# Patient Record
Sex: Female | Born: 1940 | Race: White | Hispanic: No | Marital: Married | Smoking: Never smoker
Health system: Southern US, Community
[De-identification: ages and names within clinical notes are randomized; demographics above are authoritative.]

## PROBLEM LIST (undated history)

## (undated) DIAGNOSIS — I4891 Unspecified atrial fibrillation: Secondary | ICD-10-CM

## (undated) DIAGNOSIS — I509 Heart failure, unspecified: Secondary | ICD-10-CM

## (undated) DIAGNOSIS — E7800 Pure hypercholesterolemia, unspecified: Secondary | ICD-10-CM

## (undated) DIAGNOSIS — E079 Disorder of thyroid, unspecified: Secondary | ICD-10-CM

## (undated) HISTORY — PX: MEDIAL PARTIAL KNEE REPLACEMENT: SHX5965

## (undated) HISTORY — PX: CHOLECYSTECTOMY: SHX55

## (undated) HISTORY — PX: ABDOMINAL HYSTERECTOMY: SHX81

## (undated) HISTORY — PX: CARDIAC ELECTROPHYSIOLOGY STUDY AND ABLATION: SHX1294

---

## 2013-10-12 ENCOUNTER — Emergency Department (HOSPITAL_COMMUNITY): Payer: Medicare Other

## 2013-10-12 ENCOUNTER — Emergency Department (HOSPITAL_COMMUNITY)
Admission: EM | Admit: 2013-10-12 | Discharge: 2013-10-12 | Disposition: A | Discharge status: Home / Self Care | Payer: Medicare Other | Attending: Emergency Medicine | Admitting: Emergency Medicine

## 2013-10-12 ENCOUNTER — Encounter (HOSPITAL_COMMUNITY): Payer: Self-pay | Admitting: Emergency Medicine

## 2013-10-12 DIAGNOSIS — S99929A Unspecified injury of unspecified foot, initial encounter: Secondary | ICD-10-CM

## 2013-10-12 DIAGNOSIS — E079 Disorder of thyroid, unspecified: Secondary | ICD-10-CM | POA: Insufficient documentation

## 2013-10-12 DIAGNOSIS — S99919A Unspecified injury of unspecified ankle, initial encounter: Secondary | ICD-10-CM

## 2013-10-12 DIAGNOSIS — S0990XA Unspecified injury of head, initial encounter: Secondary | ICD-10-CM | POA: Insufficient documentation

## 2013-10-12 DIAGNOSIS — R51 Headache: Secondary | ICD-10-CM

## 2013-10-12 DIAGNOSIS — S8990XA Unspecified injury of unspecified lower leg, initial encounter: Secondary | ICD-10-CM | POA: Insufficient documentation

## 2013-10-12 DIAGNOSIS — E7800 Pure hypercholesterolemia, unspecified: Secondary | ICD-10-CM | POA: Insufficient documentation

## 2013-10-12 DIAGNOSIS — W010XXA Fall on same level from slipping, tripping and stumbling without subsequent striking against object, initial encounter: Secondary | ICD-10-CM | POA: Insufficient documentation

## 2013-10-12 DIAGNOSIS — Y9229 Other specified public building as the place of occurrence of the external cause: Secondary | ICD-10-CM | POA: Insufficient documentation

## 2013-10-12 DIAGNOSIS — M25512 Pain in left shoulder: Secondary | ICD-10-CM

## 2013-10-12 DIAGNOSIS — I509 Heart failure, unspecified: Secondary | ICD-10-CM | POA: Insufficient documentation

## 2013-10-12 DIAGNOSIS — R519 Headache, unspecified: Secondary | ICD-10-CM

## 2013-10-12 DIAGNOSIS — S46909A Unspecified injury of unspecified muscle, fascia and tendon at shoulder and upper arm level, unspecified arm, initial encounter: Secondary | ICD-10-CM | POA: Insufficient documentation

## 2013-10-12 DIAGNOSIS — W19XXXA Unspecified fall, initial encounter: Secondary | ICD-10-CM

## 2013-10-12 DIAGNOSIS — S4980XA Other specified injuries of shoulder and upper arm, unspecified arm, initial encounter: Secondary | ICD-10-CM | POA: Insufficient documentation

## 2013-10-12 DIAGNOSIS — Y939 Activity, unspecified: Secondary | ICD-10-CM | POA: Insufficient documentation

## 2013-10-12 DIAGNOSIS — I4891 Unspecified atrial fibrillation: Secondary | ICD-10-CM | POA: Insufficient documentation

## 2013-10-12 DIAGNOSIS — Z96659 Presence of unspecified artificial knee joint: Secondary | ICD-10-CM | POA: Insufficient documentation

## 2013-10-12 HISTORY — DX: Heart failure, unspecified: I50.9

## 2013-10-12 HISTORY — DX: Pure hypercholesterolemia, unspecified: E78.00

## 2013-10-12 HISTORY — DX: Disorder of thyroid, unspecified: E07.9

## 2013-10-12 HISTORY — DX: Unspecified atrial fibrillation: I48.91

## 2013-10-12 NOTE — ED Provider Notes (Signed)
CSN: [id number]     Arrival date & time 10/12/13  1312 History   First MD Initiated Contact with Patient 10/12/13 1330     Chief Complaint  Patient presents with  . Fall     (Consider location/radiation/quality/duration/timing/severity/associated sxs/prior Treatment) HPI 73 yo female presents [place] at airport today around 11:30 today. Patient went down face first. Denies LOC but uncertain if she hit her head or not. Paitient does admit to HA and is concerned because she is on Eloquis for her A. Fib. Patient also states that her left shoulder and left knee also hurt. Patient states she hit her knee and fell on her left shoulder with arm adducted to left side. Denies dizziness, CP, or SOB. Patient admits left sided HA rated at 4/10 currently. Pain is constant and described as pressure pain.   Past Medical History  Diagnosis Date  . Atrial fibrillation   . High cholesterol   . Thyroid disease   . CHF (congestive heart failure)    Past Surgical History  Procedure Laterality Date  . Medial partial knee replacement    . Abdominal hysterectomy    . Cardiac electrophysiology study and ablation    . Cholecystectomy     History reviewed. No pertinent family history. History  Substance Use Topics  . Smoking status: Never Smoker   . Smokeless tobacco: Never Used  . Alcohol Use: No   OB History   Grav Para Term Preterm Abortions TAB SAB Ect Mult Living                 Review of Systems  All other systems reviewed and are negative.     Allergies  Prednisone and Tdap  Home Medications   Prior to Admission medications   Not on File   BP 142/63  Pulse 68  Temp(Src) 97.9 F (36.6 C) (Oral)  Resp 18  Ht 5\' 4"  (1.626 m)  Wt 185 lb (83.915 kg)  BMI 31.74 kg/m2  SpO2 99% Physical Exam  Nursing note and vitals reviewed. Constitutional: She is oriented to person, place, and time. She appears well-developed and well-nourished. No distress.  HENT:  Head:  Normocephalic and atraumatic. Head is without raccoon's eyes, without Battle's sign, without abrasion, without contusion, without laceration, without right periorbital erythema and without left periorbital erythema.  Right Ear: Tympanic membrane and ear canal normal. Tympanic membrane is not perforated. No hemotympanum.  Left Ear: Tympanic membrane and ear canal normal. Tympanic membrane is not perforated. No hemotympanum.  Nose: Nose normal.  Mouth/Throat: Uvula is midline, oropharynx is clear and moist and mucous membranes are normal.  Eyes: Conjunctivae and EOM are normal. Pupils are equal, round, and reactive to light.  Neck: Trachea normal, normal range of motion and phonation normal. Neck supple. No JVD present. Carotid bruit is not present. No tracheal deviation present.  Cardiovascular: Normal rate and regular rhythm.  Exam reveals no gallop and no friction rub.   No murmur heard. Pulmonary/Chest: Effort normal and breath sounds normal. No respiratory distress. She has no wheezes. She has no rhonchi. She has no rales.  Musculoskeletal: Normal range of motion. She exhibits no edema.  Neurological: She is alert and oriented to person, place, and time. She has normal strength. No cranial nerve deficit or sensory deficit. GCS eye subscore is 4. GCS verbal subscore is 5. GCS motor subscore is 6.  Reflex Scores:      Bicep reflexes are 2+ on the right side and 2+ on  the left side.      Patellar reflexes are 2+ on the right side and 2+ on the left side. CN II-XII grossly intact. Cerebellar function appears intact with finger to nose exam. Patient ambulates in room without assistance.    Skin: Skin is warm and dry. She is not diaphoretic.  Psychiatric: She has a normal mood and affect. Her behavior is normal.    ED Course  Procedures (including critical care time) Labs Review Labs Reviewed - No data to display  Imaging Review Ct Head Wo Contrast  10/12/2013   CLINICAL DATA:  Head trauma.  Fall with head trauma. Patient on blood thinners.  EXAM: CT HEAD WITHOUT CONTRAST  TECHNIQUE: Contiguous axial images were obtained from the base of the skull through the vertex without intravenous contrast.  COMPARISON:  None.  FINDINGS: Intracranial atherosclerosis is present. Mastoid air cells and paranasal sinuses clear. Left-sided nasal septal spur. No mass lesion, mass effect, midline shift, hydrocephalus, hemorrhage. No territorial ischemia or acute infarction. Scout images appear within normal limits.  IMPRESSION: Negative CT head.   Electronically Signed   By: Andreas NewportGeoffrey  Lamke M.D.   On: 10/12/2013 14:59   Dg Shoulder Left  10/12/2013   CLINICAL DATA:  Pain post fall  EXAM: LEFT SHOULDER - 2+ VIEW  COMPARISON:  None.  FINDINGS: Three views of the left shoulder submitted. No acute fracture or subluxation. Degenerative changes are noted left AC joint. Mild inferior spurring of acromion. Glenohumeral joint is preserved.  IMPRESSION: No acute fracture or subluxation. Degenerative changes as described above.   Electronically Signed   By: Natasha MeadLiviu  Pop M.D.   On: 10/12/2013 15:16     EKG Interpretation   Date/Time:  Monday October 12 2013 13:13:30 EDT Ventricular Rate:  67 PR Interval:  146 QRS Duration: 77 QT Interval:  439 QTC Calculation: 463 R Axis:   7 Text Interpretation:  Sinus rhythm Low voltage, precordial leads  Borderline T abnormalities, anterior leads Confirmed by Freida BusmanALLEN  MD, ANTHONY  ([id number]) on 10/12/2013 2:44:26 PM Also confirmed by Freida BusmanALLEN  MD, ANTHONY  ([id number])  on 10/12/2013 3:23:37 PM      MDM   Final diagnoses:  Fall  HA (headache)  Shoulder pain, left   Patient with hx of blood thinner use presents after fall with HA with concern for bleed.   Filed Vitals:   10/12/13 1315 10/12/13 1525  BP: 125/62 142/63  Pulse: 68   Temp: 97.9 F (36.6 C)   TempSrc: Oral   Resp: 18   Height: 5\' 4"  (1.626 m)   Weight: 185 lb (83.915 kg)   SpO2: 99%     Patient afebrile with  normal VS.  Patient has normal neuro exam with no focal deficits.  CT head is negative. No evidence of bleed.  Left shoulder plain films show degenerative changes without evidence of acute fracture or dislocation.   Discussed results with patient. Plan to have patient follow up with her PCP in 2 days for reevaluation. Recommend tylenol for pain as needed. Return precautions for worsening HA, dizziness, or vomiting given.  Patient agrees with plan. Discharged in good condition.     Rudene AndaJacob Gray Rylan Kaufmann, PA-C 10/13/13 [phone]

## 2013-10-12 NOTE — Discharge Instructions (Signed)
Take tylenol as needed for pain. Follow up with your doctor in 2 days. Return to Emergency department if you develop any worsening symptoms of headache, dizziness, or vomiting.    Headaches, Frequently Asked Questions MIGRAINE HEADACHES Q: What is migraine? What causes it? How can I treat it? A: Generally, migraine headaches begin as a dull ache. Then they develop into a constant, throbbing, and pulsating pain. You may experience pain at the temples. You may experience pain at the front or back of one or both sides of the head. The pain is usually accompanied by a combination of:  Nausea.  Vomiting.  Sensitivity to light and noise. Some people (about 15%) experience an aura (see below) before an attack. The cause of migraine is believed to be chemical reactions in the brain. Treatment for migraine may include over-the-counter or prescription medications. It may also include self-help techniques. These include relaxation training and biofeedback.  Q: What is an aura? A: About 15% of people with migraine get an "aura". This is a sign of neurological symptoms that occur before a migraine headache. You may see wavy or jagged lines, dots, or flashing lights. You might experience tunnel vision or blind spots in one or both eyes. The aura can include visual or auditory hallucinations (something imagined). It may include disruptions in smell (such as strange odors), taste or touch. Other symptoms include:  Numbness.  A "pins and needles" sensation.  Difficulty in recalling or speaking the correct word. These neurological events may last as long as 60 minutes. These symptoms will fade as the headache begins. Q: What is a trigger? A: Certain physical or environmental factors can lead to or "trigger" a migraine. These include:  Foods.  Hormonal changes.  Weather.  Stress. It is important to remember that triggers are different for everyone. To help prevent migraine attacks, you need to figure  out which triggers affect you. Keep a headache diary. This is a good way to track triggers. The diary will help you talk to your healthcare professional about your condition. Q: Does weather affect migraines? A: Bright sunshine, hot, humid conditions, and drastic changes in barometric pressure may lead to, or "trigger," a migraine attack in some people. But studies have shown that weather does not act as a trigger for everyone with migraines. Q: What is the link between migraine and hormones? A: Hormones start and regulate many of your body's functions. Hormones keep your body in balance within a constantly changing environment. The levels of hormones in your body are unbalanced at times. Examples are during menstruation, pregnancy, or menopause. That can lead to a migraine attack. In fact, about three quarters of all women with migraine report that their attacks are related to the menstrual cycle.  Q: Is there an increased risk of stroke for migraine sufferers? A: The likelihood of a migraine attack causing a stroke is very remote. That is not to say that migraine sufferers cannot have a stroke associated with their migraines. In persons under age 73, the most common associated factor for stroke is migraine headache. But over the course of a person's normal life span, the occurrence of migraine headache may actually be associated with a reduced risk of dying from cerebrovascular disease due to stroke.  Q: What are acute medications for migraine? A: Acute medications are used to treat the pain of the headache after it has started. Examples over-the-counter medications, NSAIDs, ergots, and triptans.  Q: What are the triptans? A: Triptans are the newest  class of abortive medications. They are specifically targeted to treat migraine. Triptans are vasoconstrictors. They moderate some chemical reactions in the brain. The triptans work on receptors in your brain. Triptans help to restore the balance of a  neurotransmitter called serotonin. Fluctuations in levels of serotonin are thought to be a main cause of migraine.  Q: Are over-the-counter medications for migraine effective? A: Over-the-counter, or "OTC," medications may be effective in relieving mild to moderate pain and associated symptoms of migraine. But you should see your caregiver before beginning any treatment regimen for migraine.  Q: What are preventive medications for migraine? A: Preventive medications for migraine are sometimes referred to as "prophylactic" treatments. They are used to reduce the frequency, severity, and length of migraine attacks. Examples of preventive medications include antiepileptic medications, antidepressants, beta-blockers, calcium channel blockers, and NSAIDs (nonsteroidal anti-inflammatory drugs). Q: Why are anticonvulsants used to treat migraine? A: During the past few years, there has been an increased interest in antiepileptic drugs for the prevention of migraine. They are sometimes referred to as "anticonvulsants". Both epilepsy and migraine may be caused by similar reactions in the brain.  Q: Why are antidepressants used to treat migraine? A: Antidepressants are typically used to treat people with depression. They may reduce migraine frequency by regulating chemical levels, such as serotonin, in the brain.  Q: What alternative therapies are used to treat migraine? A: The term "alternative therapies" is often used to describe treatments considered outside the scope of conventional Western medicine. Examples of alternative therapy include acupuncture, acupressure, and yoga. Another common alternative treatment is herbal therapy. Some herbs are believed to relieve headache pain. Always discuss alternative therapies with your caregiver before proceeding. Some herbal products contain arsenic and other toxins. TENSION HEADACHES Q: What is a tension-type headache? What causes it? How can I treat it? A:  Tension-type headaches occur randomly. They are often the result of temporary stress, anxiety, fatigue, or anger. Symptoms include soreness in your temples, a tightening band-like sensation around your head (a "vice-like" ache). Symptoms can also include a pulling feeling, pressure sensations, and contracting head and neck muscles. The headache begins in your forehead, temples, or the back of your head and neck. Treatment for tension-type headache may include over-the-counter or prescription medications. Treatment may also include self-help techniques such as relaxation training and biofeedback. CLUSTER HEADACHES Q: What is a cluster headache? What causes it? How can I treat it? A: Cluster headache gets its name because the attacks come in groups. The pain arrives with little, if any, warning. It is usually on one side of the head. A tearing or bloodshot eye and a runny nose on the same side of the headache may also accompany the pain. Cluster headaches are believed to be caused by chemical reactions in the brain. They have been described as the most severe and intense of any headache type. Treatment for cluster headache includes prescription medication and oxygen. SINUS HEADACHES Q: What is a sinus headache? What causes it? How can I treat it? A: When a cavity in the bones of the face and skull (a sinus) becomes inflamed, the inflammation will cause localized pain. This condition is usually the result of an allergic reaction, a tumor, or an infection. If your headache is caused by a sinus blockage, such as an infection, you will probably have a fever. An x-ray will confirm a sinus blockage. Your caregiver's treatment might include antibiotics for the infection, as well as antihistamines or decongestants.  REBOUND HEADACHES Q:  What is a rebound headache? What causes it? How can I treat it? A: A pattern of taking acute headache medications too often can lead to a condition known as "rebound headache." A  pattern of taking too much headache medication includes taking it more than 2 days per week or in excessive amounts. That means more than the label or a caregiver advises. With rebound headaches, your medications not only stop relieving pain, they actually begin to cause headaches. Doctors treat rebound headache by tapering the medication that is being overused. Sometimes your caregiver will gradually substitute a different type of treatment or medication. Stopping may be a challenge. Regularly overusing a medication increases the potential for serious side effects. Consult a caregiver if you regularly use headache medications more than 2 days per week or more than the label advises. ADDITIONAL QUESTIONS AND ANSWERS Q: What is biofeedback? A: Biofeedback is a self-help treatment. Biofeedback uses special equipment to monitor your body's involuntary physical responses. Biofeedback monitors:  Breathing.  Pulse.  Heart rate.  Temperature.  Muscle tension.  Brain activity. Biofeedback helps you refine and perfect your relaxation exercises. You learn to control the physical responses that are related to stress. Once the technique has been mastered, you do not need the equipment any more. Q: Are headaches hereditary? A: Four out of five (80%) of people that suffer report a family history of migraine. Scientists are not sure if this is genetic or a family predisposition. Despite the uncertainty, a child has a 50% chance of having migraine if one parent suffers. The child has a 75% chance if both parents suffer.  Q: Can children get headaches? A: By the time they reach high school, most young people have experienced some type of headache. Many safe and effective approaches or medications can prevent a headache from occurring or stop it after it has begun.  Q: What type of doctor should I see to diagnose and treat my headache? A: Start with your primary caregiver. Discuss his or her experience and  approach to headaches. Discuss methods of classification, diagnosis, and treatment. Your caregiver may decide to recommend you to a headache specialist, depending upon your symptoms or other physical conditions. Having diabetes, allergies, etc., may require a more comprehensive and inclusive approach to your headache. The National [employer] Foundation will provide, upon request, a list of [facility] physician members in your state. Document Released: 09/01/2003 Document Revised: 09/03/2011 Document Reviewed: 02/09/2008 [facility] Patient Information 2014 [place], [place].

## 2013-10-12 NOTE — ED Notes (Signed)
Pt from airport, c/o fall. Pt states she tripped on metal plate. Denies LOC. Pt states pain on left side, knee, shoulder and states she does not know if she hit her head, states left forehead is sore. Pt on blood thinners.

## 2013-10-12 NOTE — ED Provider Notes (Signed)
Medical screening examination/treatment/procedure(s) were conducted as a shared visit with non-physician practitioner(s) and myself.  I personally evaluated the patient during the encounter.   EKG Interpretation   Date/Time:  Monday October 12 2013 13:13:30 EDT Ventricular Rate:  67 PR Interval:  146 QRS Duration: 77 QT Interval:  439 QTC Calculation: 463 R Axis:   7 Text Interpretation:  Sinus rhythm Low voltage, precordial leads  Borderline T abnormalities, anterior leads Confirmed by Freida BusmanALLEN  MD, Zarina Pe  ([id number]) on 10/12/2013 2:44:26 PM Also confirmed by Freida BusmanALLEN  MD, Kaeleen Odom  ([id number])  on 10/12/2013 3:23:37 PM     Patient here after a mechanical fall without loss of consciousness. Head CT negative. Shoulder x-ray negative. Patient stable for discharge  Toy BakerAnthony T Adelina Collard, MD 10/12/13 1540

## 2013-10-16 NOTE — ED Provider Notes (Signed)
Medical screening examination/treatment/procedure(s) were conducted as a shared visit with non-physician practitioner(s) and myself.  I personally evaluated the patient during the encounter.   EKG Interpretation   Date/Time:  Monday October 12 2013 13:13:30 EDT Ventricular Rate:  67 PR Interval:  146 QRS Duration: 77 QT Interval:  439 QTC Calculation: 463 R Axis:   7 Text Interpretation:  Sinus rhythm Low voltage, precordial leads  Borderline T abnormalities, anterior leads Confirmed by Freida BusmanALLEN  MD, Jeremiah Tarpley  ([id number]) on 10/12/2013 2:44:26 PM Also confirmed by Freida BusmanALLEN  MD, Tresha Muzio  ([id number])  on 10/12/2013 3:23:37 PM       Toy BakerAnthony T Emannuel Vise, MD 10/16/13 1427

## 2015-11-09 IMAGING — CT CT HEAD W/O CM
1 series · 16 of 29 positions shown, 20 images · non-contrast
Comparison: None.

CLINICAL DATA: Head trauma. Fall with head trauma. Patient on blood
thinners.

EXAM:
CT HEAD WITHOUT CONTRAST
TECHNIQUE: Contiguous axial images were obtained from the base of the skull
through the vertex without intravenous contrast.

[Series 2: head 5.0 h30s · axial · 0.42mm/px · z∈[-214,-84]mm · 16 of 29 slices shown, 20 images]
[im 2/29  brain]
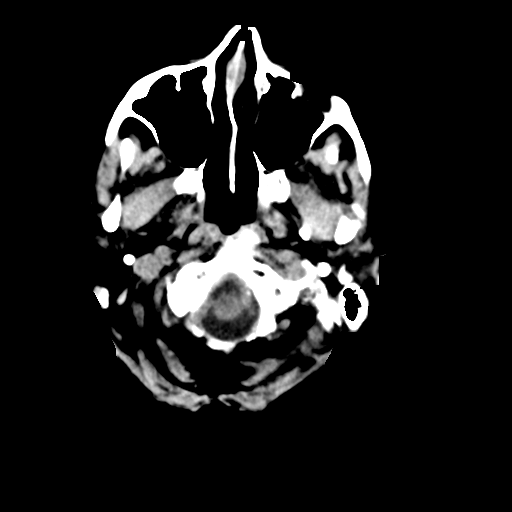
[im 2/29  bone]
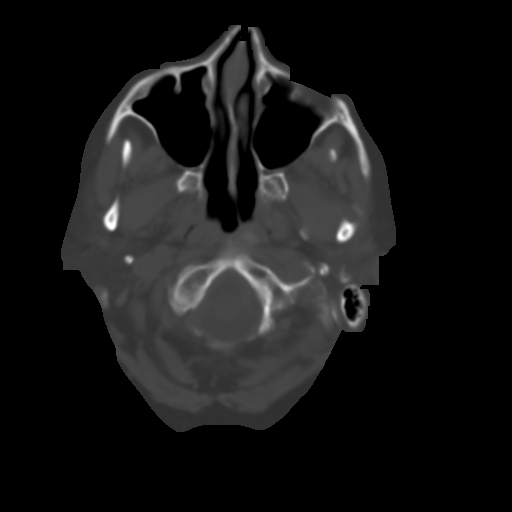
[im 4/29  brain]
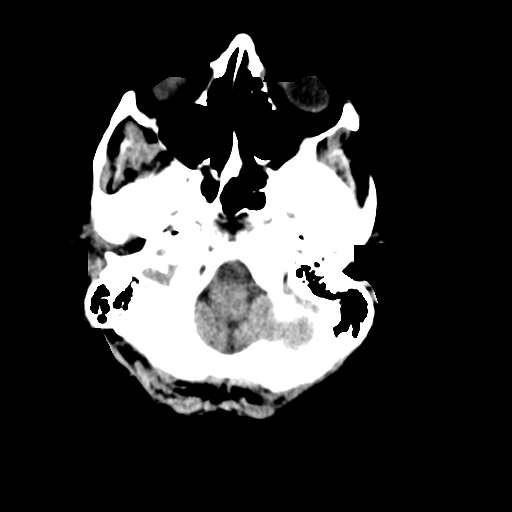
[im 6/29  brain]
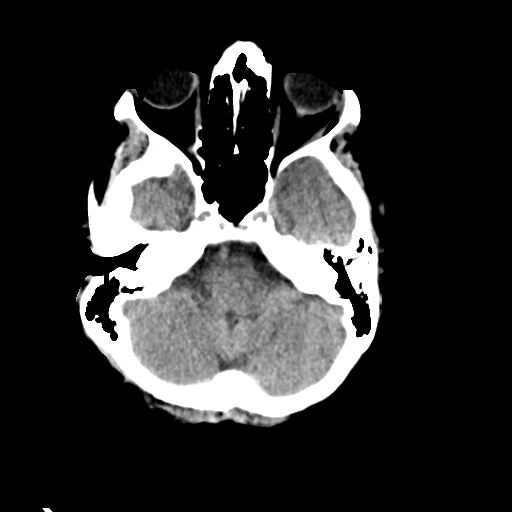
[im 7/29  brain]
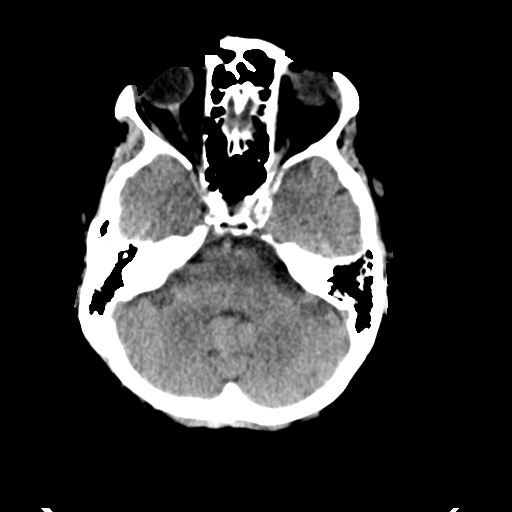
[im 9/29  brain]
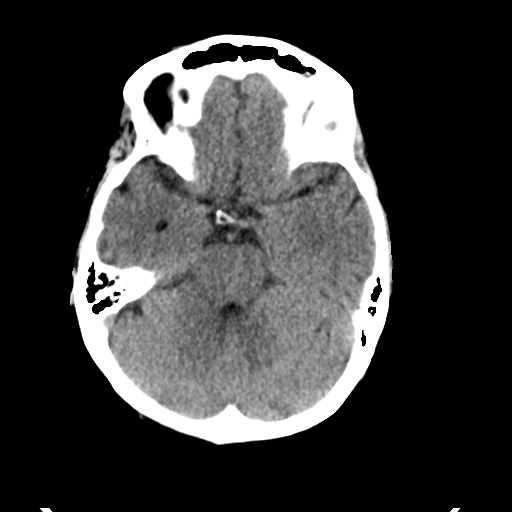
[im 9/29  bone]
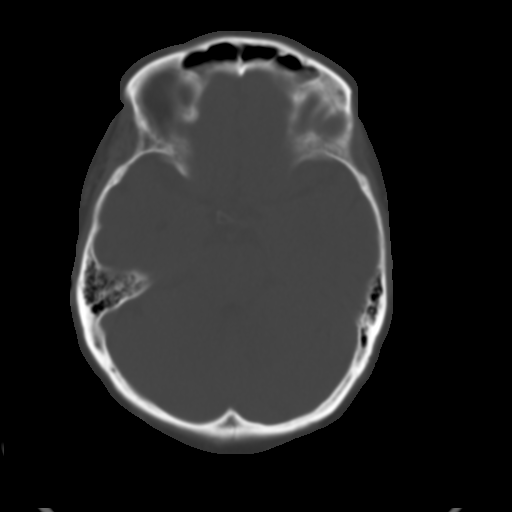
[im 11/29  brain]
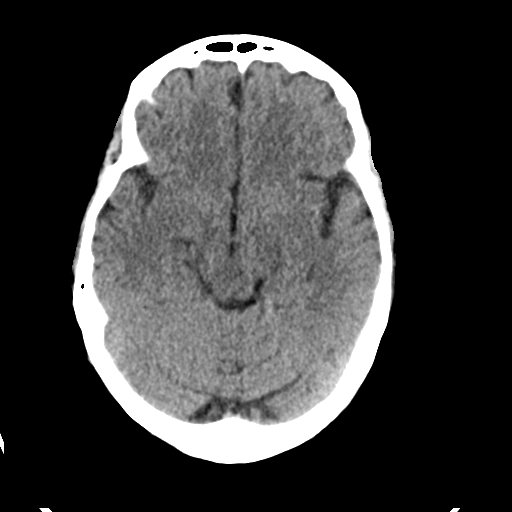
[im 12/29  brain]
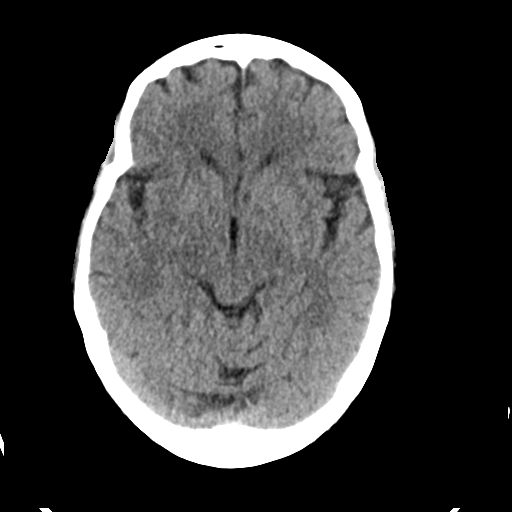
[im 14/29  brain]
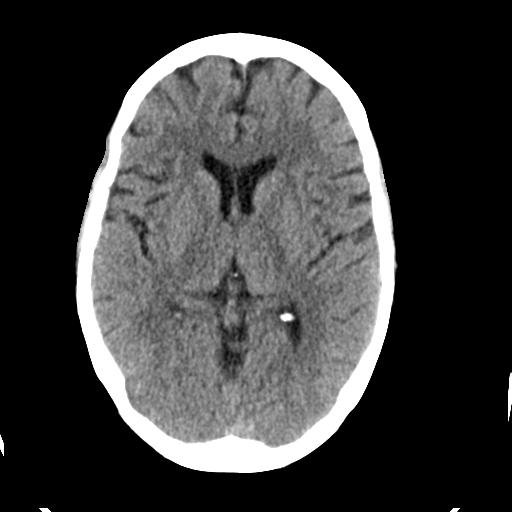
[im 16/29  brain]
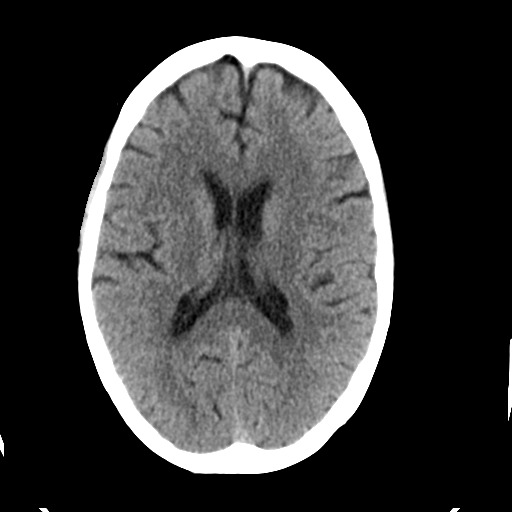
[im 16/29  bone]
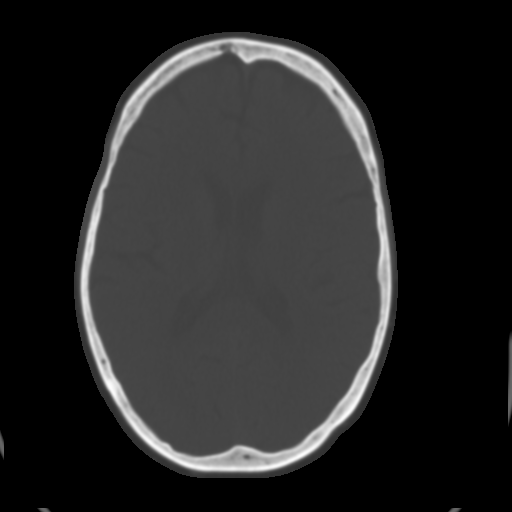
[im 18/29  brain]
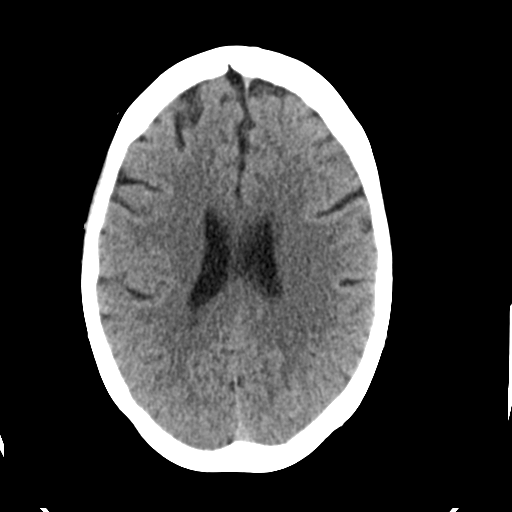
[im 19/29  brain]
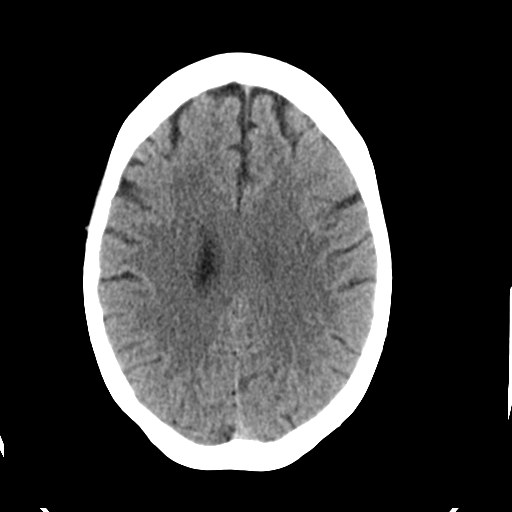
[im 21/29  brain]
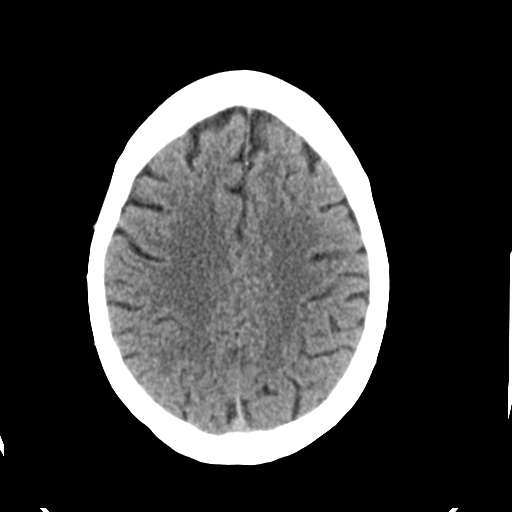
[im 23/29  brain]
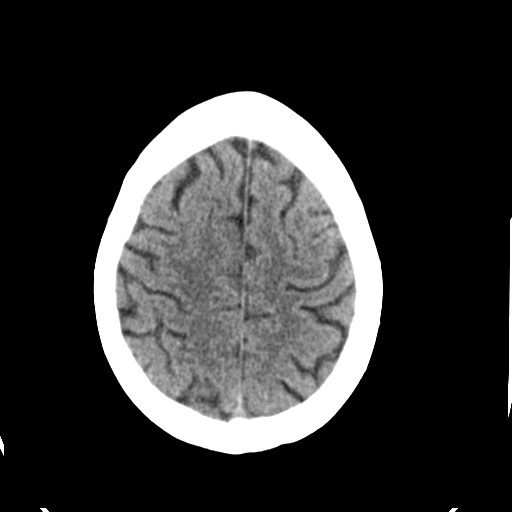
[im 23/29  bone]
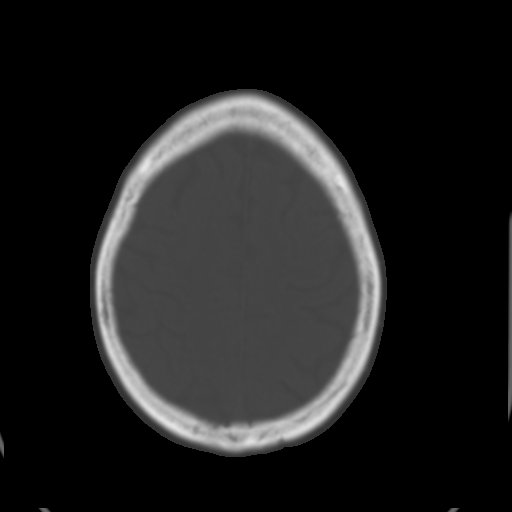
[im 24/29  brain]
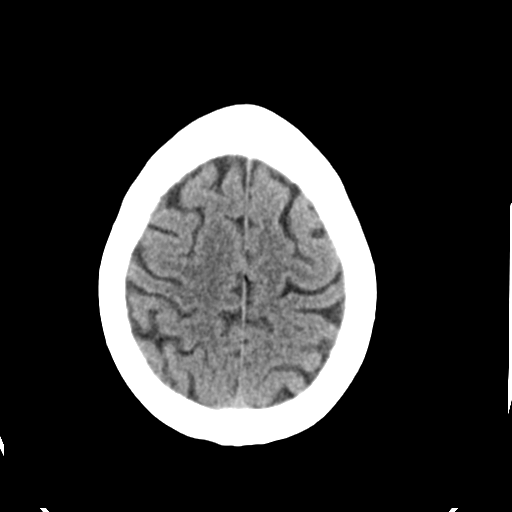
[im 26/29  brain]
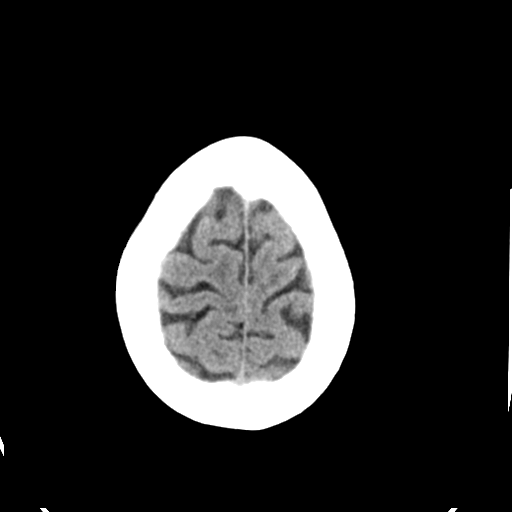
[im 28/29  brain]
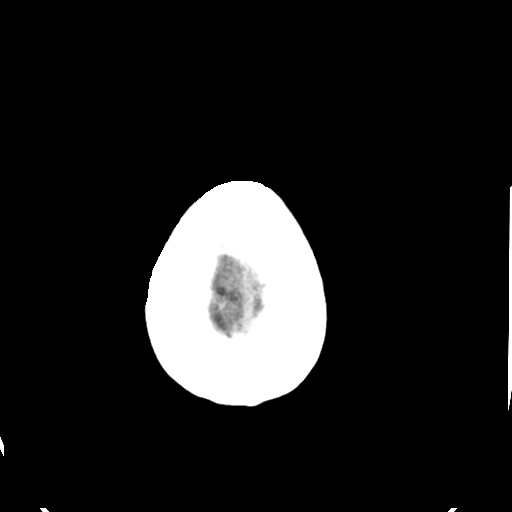

[16 of 29 positions shown; findings below may reference images not displayed]

FINDINGS: Intracranial atherosclerosis is present. Mastoid air cells and
paranasal sinuses clear. Left-sided nasal septal spur. No mass
lesion, mass effect, midline shift, hydrocephalus, hemorrhage. No
territorial ischemia or acute infarction. Scout images appear within
normal limits.
IMPRESSION: Negative CT head.
